# Patient Record
Sex: Male | Born: 1985
Health system: Southern US, Community
[De-identification: ages and names within clinical notes are randomized; demographics above are authoritative.]

---

## 2016-07-12 DIAGNOSIS — M722 Plantar fascial fibromatosis: Secondary | ICD-10-CM | POA: Insufficient documentation

## 2017-07-24 DIAGNOSIS — Z13220 Encounter for screening for lipoid disorders: Secondary | ICD-10-CM | POA: Diagnosis not present

## 2017-07-24 DIAGNOSIS — Z0000 Encounter for general adult medical examination without abnormal findings: Secondary | ICD-10-CM | POA: Diagnosis not present

## 2017-07-24 DIAGNOSIS — M25551 Pain in right hip: Secondary | ICD-10-CM | POA: Diagnosis not present

## 2017-08-07 DIAGNOSIS — M25851 Other specified joint disorders, right hip: Secondary | ICD-10-CM | POA: Diagnosis not present

## 2018-01-31 ENCOUNTER — Other Ambulatory Visit: Payer: Self-pay | Admitting: Internal Medicine

## 2018-01-31 DIAGNOSIS — M5489 Other dorsalgia: Secondary | ICD-10-CM | POA: Diagnosis not present

## 2018-02-07 ENCOUNTER — Ambulatory Visit
Admission: RE | Admit: 2018-02-07 | Discharge: 2018-02-07 | Disposition: A | Discharge status: Home / Self Care | Payer: 59 | Source: Ambulatory Visit | Attending: Internal Medicine | Admitting: Internal Medicine

## 2018-02-07 DIAGNOSIS — N1330 Unspecified hydronephrosis: Secondary | ICD-10-CM | POA: Diagnosis not present

## 2018-02-07 DIAGNOSIS — M5489 Other dorsalgia: Secondary | ICD-10-CM

## 2018-02-07 DIAGNOSIS — M549 Dorsalgia, unspecified: Secondary | ICD-10-CM | POA: Diagnosis not present

## 2018-02-07 DIAGNOSIS — N1339 Other hydronephrosis: Secondary | ICD-10-CM | POA: Diagnosis not present

## 2018-02-26 NOTE — Progress Notes (Signed)
02/27/2018 11:21 AM   Kevin EddyIan Percle 22-Sep-1985 [id number]  Referring provider: Marguarite ArbourSparks, Jeffrey D, MD [address] [facility] [place], [place] [id number]  Chief Complaint  Patient presents with  . Hydronephrosis    HPI: Patient is a 32 year old Caucasian male with bilateral hydronephrosis found on RUS referred by Dr. Judithann SheenSparks.  He presented to Dr. Judithann SheenSparks on 01/31/2018 for the complaints of right flank and right back pain that radiates to the right groin for two weeks.  RUS on 02/07/2018 noted BILATERAL mild hydronephrosis. Echogenic focus measuring 8 mm, RIGHT kidney lower pole collecting system, could represent debris or a nonshadowing calculus.  He states that he has had a right lower quadrant pain off and on for several months.  He then developed a right flank pain one month ago which prompted his visit to his PCP.  Patient denies any gross hematuria, dysuria or suprapubic/flank pain.  Patient denies any fevers, chills, nausea or vomiting.   His only urinating complaint is nocturia, but this is due to getting up with a newborn.    He does not have a history of stones.  He does not have a family history of stones.    He has not noted a bulge in the right lower quadrant.    PMH: History reviewed. No pertinent past medical history.  Surgical History: History reviewed. No pertinent surgical history.  Home Medications:  Allergies as of 02/27/2018   No Known Allergies     Medication List    as of 02/27/2018 11:21 AM   You have not been prescribed any medications.     Allergies: No Known Allergies  Family History: Family History  Problem Relation Age of Onset  . Prostate cancer Father     Social History:  reports that he has never smoked. He has never used smokeless tobacco. He reports that he drinks alcohol. He reports that he does not use drugs.  ROS: UROLOGY Frequent Urination?: No Hard to postpone urination?: No Burning/pain with urination?:  No Get up at night to urinate?: Yes Leakage of urine?: No Urine stream starts and stops?: No Trouble starting stream?: No Do you have to strain to urinate?: No Blood in urine?: No Urinary tract infection?: No Sexually transmitted disease?: No Injury to kidneys or bladder?: No Painful intercourse?: No Weak stream?: No Erection problems?: No Penile pain?: No  Gastrointestinal Nausea?: No Vomiting?: No Indigestion/heartburn?: No Diarrhea?: No Constipation?: No  Constitutional Fever: No Night sweats?: No Weight loss?: No Fatigue?: No  Skin Skin rash/lesions?: No Itching?: No  Eyes Blurred vision?: No Double vision?: No  Ears/Nose/Throat Sore throat?: No Sinus problems?: No  Hematologic/Lymphatic Swollen glands?: No Easy bruising?: No  Cardiovascular Leg swelling?: No Chest pain?: No  Respiratory Cough?: No Shortness of breath?: No  Endocrine Excessive thirst?: No  Musculoskeletal Back pain?: Yes Joint pain?: No  Neurological Headaches?: No Dizziness?: No  Psychologic Depression?: No Anxiety?: No  Physical Exam: BP 118/76 (BP Location: Left Arm, Patient Position: Sitting, Cuff Size: Large)   Pulse 64   Ht 6' (1.829 m)   Wt 213 lb 14.4 oz (97 kg)   BMI 29.01 kg/m   Constitutional:  Well nourished. Alert and oriented, No acute distress. HEENT: [place] AT, moist mucus membranes.  Trachea midline, no masses. Cardiovascular: No clubbing, cyanosis, or edema. Respiratory: Normal respiratory effort, no increased work of breathing. GI: Abdomen is soft, non tender, non distended, no abdominal masses. Liver and spleen not palpable.  No  hernias appreciated.  Stool sample for occult testing is not indicated.   GU: No CVA tenderness.  No bladder fullness or masses.  Patient with circumcised phallus.  Urethral meatus is patent.  No penile discharge. No penile lesions or rashes. Scrotum without lesions, cysts, rashes and/or edema.  Testicles are located scrotally  bilaterally. No masses are appreciated in the testicles. Left and right epididymis are normal. Rectal: Not performed Skin: No rashes, bruises or suspicious lesions. Lymph: No cervical or inguinal adenopathy. Neurologic: Grossly intact, no focal deficits, moving all 4 extremities. Psychiatric: Normal mood and affect.  Laboratory Data: PSA Trend  1.24 in 06/2016  0.89 in 07/2017  No results found for: WBC, HGB, HCT, MCV, PLT  No results found for: CREATININE  No results found for: PSA  No results found for: TESTOSTERONE  No results found for: HGBA1C  No results found for: TSH  No results found for: CHOL, HDL, CHOLHDL, VLDL, LDLCALC  No results found for: AST No results found for: ALT No components found for: ALKALINEPHOPHATASE No components found for: BILIRUBINTOTAL  No results found for: ESTRADIOL  Urinalysis Negative.   I have reviewed the labs.   Pertinent Imaging: CLINICAL DATA:  Back pain with sciatica.  EXAM: RENAL / URINARY TRACT ULTRASOUND COMPLETE  COMPARISON:  None.  FINDINGS: Right Kidney:  Length: 12.4 cm. Mild hydronephrosis. Normal echogenicity. Echogenic focus, 8 mm, lower pole collecting system could represent debris or a nonshadowing calculus.  Left Kidney:  Length: 12.3 cm. Mild hydronephrosis. Normal echogenicity. No mass.  Bladder:  Appears normal for degree of bladder distention.  IMPRESSION: BILATERAL mild hydronephrosis. Echogenic focus measuring 8 mm, RIGHT kidney lower pole collecting system, could represent debris or a nonshadowing calculus.   Electronically Signed   By: Elsie StainJohn T Curnes M.D.   On: 02/07/2018 15:45 I have independently reviewed the films.    Assessment & Plan:    1. Bilateral hydronephrosis Obtain a CTU  - I explained to the patient that a contrast material will be injected into a vein and that in rare instances, an allergic reaction can result and may even life threatening   The patient denies  any allergies to contrast, iodine and/or seafood and is not taking metformin. The patient will return following all of the above for discussion of the results.  UA negative Urine culture  2. Right renal stone CTU pending    Return for CT report .  These notes generated with voice recognition software. I apologize for typographical errors.  Michiel CowboySHANNON Dravyn Severs, PA-C  [facility] [address]  [address] [place], [place] [address] [phone]

## 2018-02-27 ENCOUNTER — Ambulatory Visit (INDEPENDENT_AMBULATORY_CARE_PROVIDER_SITE_OTHER): Payer: 59 | Admitting: Urology

## 2018-02-27 ENCOUNTER — Encounter: Payer: Self-pay | Admitting: Urology

## 2018-02-27 VITALS — BP 118/76 | HR 64 | Ht 72.0 in | Wt 213.9 lb

## 2018-02-27 DIAGNOSIS — N1330 Unspecified hydronephrosis: Secondary | ICD-10-CM

## 2018-02-27 DIAGNOSIS — N200 Calculus of kidney: Secondary | ICD-10-CM | POA: Diagnosis not present

## 2018-02-27 LAB — URINALYSIS, COMPLETE
Bilirubin, UA: NEGATIVE
Glucose, UA: NEGATIVE
Ketones, UA: NEGATIVE
Leukocytes, UA: NEGATIVE
Nitrite, UA: NEGATIVE
PH UA: 7 (ref 5.0–7.5)
Protein, UA: NEGATIVE
RBC UA: NEGATIVE
SPEC GRAV UA: 1.02 (ref 1.005–1.030)
Urobilinogen, Ur: 0.2 mg/dL (ref 0.2–1.0)

## 2018-03-02 LAB — CULTURE, URINE COMPREHENSIVE

## 2018-03-19 ENCOUNTER — Ambulatory Visit
Admission: RE | Admit: 2018-03-19 | Discharge: 2018-03-19 | Disposition: A | Discharge status: Home / Self Care | Payer: Commercial Managed Care - HMO | Source: Ambulatory Visit | Attending: Urology | Admitting: Urology

## 2018-03-19 DIAGNOSIS — N200 Calculus of kidney: Secondary | ICD-10-CM | POA: Diagnosis not present

## 2018-03-19 DIAGNOSIS — N1330 Unspecified hydronephrosis: Secondary | ICD-10-CM

## 2018-03-19 MED ORDER — IOPAMIDOL (ISOVUE-300) INJECTION 61%
150.0000 mL | Freq: Once | INTRAVENOUS | Status: AC | PRN
  Administered 2018-03-19: 150 mL via INTRAVENOUS

## 2018-03-20 ENCOUNTER — Ambulatory Visit (INDEPENDENT_AMBULATORY_CARE_PROVIDER_SITE_OTHER): Payer: 59 | Admitting: Urology

## 2018-03-20 ENCOUNTER — Encounter: Payer: Self-pay | Admitting: Urology

## 2018-03-20 VITALS — BP 136/87 | HR 69 | Resp 14 | Ht 72.0 in | Wt 214.0 lb

## 2018-03-20 DIAGNOSIS — N200 Calculus of kidney: Secondary | ICD-10-CM

## 2018-03-20 DIAGNOSIS — R1031 Right lower quadrant pain: Secondary | ICD-10-CM | POA: Diagnosis not present

## 2018-03-20 NOTE — Progress Notes (Signed)
03/20/2018 6:13 PM   Kevin Foster May 10, 1986 [id number]  Referring provider: Marguarite Arbour, MD [address] [facility] [place], [place] [address]  Chief Complaint  Patient presents with  . Follow-up    HPI: Patient is a 32 year old Caucasian male with bilateral hydronephrosis found on RUS who presents today to discuss CTU report.  Background history He presented to Dr. Judithann Sheen on 01/31/2018 for the complaints of right flank and right back pain that radiates to the right groin for two weeks.  RUS on 02/07/2018 noted BILATERAL mild hydronephrosis. Echogenic focus measuring 8 mm, RIGHT kidney lower pole collecting system, could represent debris or a nonshadowing calculus.  He states that he has had a right lower quadrant pain off and on for several months.  He then developed a right flank pain one month ago which prompted his visit to his PCP.  Patient denies any gross hematuria, dysuria or suprapubic/flank pain.  Patient denies any fevers, chills, nausea or vomiting.  His only urinating complaint is nocturia, but this is due to getting up with a newborn.   He does not have a history of stones.  He does not have a family history of stones.  He has not noted a bulge in the right lower quadrant.    CTU on 03/20/2018 revealed tiny nonobstructing right renal calculus. No evidence of ureteral calculi, hydronephrosis, or other acute findings.    He still continues to have the right lower quadrant pain.  It has not changed in its intensity, consistency, location or radiation since his last visit with [place].  He has no new symptoms to report at this visit.  Patient denies any gross hematuria, dysuria or suprapubic/flank pain.  Patient denies any fevers, chills, nausea or vomiting.   PMH: No past medical history on file.  Surgical History: No past surgical history on file.  Home Medications:  Allergies as of 03/20/2018   No Known Allergies     Medication List    as of  03/20/2018 11:59 PM   You have not been prescribed any medications.     Allergies: No Known Allergies  Family History: Family History  Problem Relation Age of Onset  . Prostate cancer Father     Social History:  reports that he has never smoked. He has never used smokeless tobacco. He reports that he drinks alcohol. He reports that he does not use drugs.  ROS: UROLOGY Frequent Urination?: No Hard to postpone urination?: No Burning/pain with urination?: No Get up at night to urinate?: No Leakage of urine?: No Urine stream starts and stops?: No Trouble starting stream?: No Do you have to strain to urinate?: No Blood in urine?: No Urinary tract infection?: No Sexually transmitted disease?: No Injury to kidneys or bladder?: No Painful intercourse?: No Weak stream?: No Erection problems?: No Penile pain?: No  Gastrointestinal Nausea?: No Vomiting?: No Indigestion/heartburn?: No Diarrhea?: No Constipation?: No  Constitutional Fever: No Night sweats?: No Weight loss?: No Fatigue?: No  Skin Skin rash/lesions?: No Itching?: No  Eyes Blurred vision?: No Double vision?: No  Ears/Nose/Throat Sore throat?: No Sinus problems?: No  Hematologic/Lymphatic Swollen glands?: No Easy bruising?: No  Cardiovascular Leg swelling?: No Chest pain?: No  Respiratory Cough?: No Shortness of breath?: No  Endocrine Excessive thirst?: No  Musculoskeletal Back pain?: Yes Joint pain?: No  Neurological Headaches?: No Dizziness?: No  Psychologic Depression?: No Anxiety?: No  Physical Exam: BP 136/87   Pulse 69   Resp 14  Ht 6' (1.829 m)   Wt 214 lb (97.1 kg)   BMI 29.02 kg/m   Constitutional: Well nourished. Alert and oriented, No acute distress. HEENT: [place] AT, moist mucus membranes. Trachea midline, no masses. Cardiovascular: No clubbing, cyanosis, or edema. Respiratory: Normal respiratory effort, no increased work of breathing. Skin: No rashes, bruises  or suspicious lesions. Lymph: No cervical or inguinal adenopathy. Neurologic: Grossly intact, no focal deficits, moving all 4 extremities. Psychiatric: Normal mood and affect.  Laboratory Data: PSA Trend  1.24 in 06/2016  0.89 in 07/2017  No results found for: WBC, HGB, HCT, MCV, PLT  No results found for: CREATININE  No results found for: PSA  No results found for: TESTOSTERONE  No results found for: HGBA1C  No results found for: TSH  No results found for: CHOL, HDL, CHOLHDL, VLDL, LDLCALC  No results found for: AST No results found for: ALT No components found for: ALKALINEPHOPHATASE No components found for: BILIRUBINTOTAL  No results found for: ESTRADIOL  Urinalysis Negative on 02/27/2018 Urine culture negative on 02/27/2018 I have reviewed the labs.   Pertinent Imaging: CLINICAL DATA:  Right flank pain and right lower quadrant pain for 2 weeks. Mild bilateral hydronephrosis seen on recent ultrasound.  EXAM: CT ABDOMEN AND PELVIS WITHOUT AND WITH CONTRAST  TECHNIQUE: Multidetector CT imaging of the abdomen and pelvis was performed following the standard protocol before and following the bolus administration of intravenous contrast.  CONTRAST:  ISOVUE-300 IOPAMIDOL (ISOVUE-300) INJECTION 61%  COMPARISON:  Renal ultrasound on 02/07/2018  FINDINGS: Lower Chest: No acute findings.  Hepatobiliary: No hepatic masses identified. Gallbladder is unremarkable.  Pancreas:  No mass or inflammatory changes.  Spleen: Within normal limits in size and appearance.  Adrenals/Urinary Tract: No adrenal masses identified. A 3 mm nonobstructing calculus is seen in the lower pole of the right kidney. No evidence of ureteral calculi or hydronephrosis. No renal masses identified. No masses seen involving the ureters or bladder.  Stomach/Bowel: No evidence of obstruction, inflammatory process or abnormal fluid collections. Normal appendix  visualized.  Vascular/Lymphatic: No pathologically enlarged lymph nodes. No abdominal aortic aneurysm.  Reproductive:  No mass or other significant abnormality.  Other:  None.  Musculoskeletal:  No suspicious bone lesions identified.  IMPRESSION: Tiny nonobstructing right renal calculus. No evidence of ureteral calculi, hydronephrosis, or other acute findings.   Electronically Signed   By: Myles Rosenthal M.D.   On: 03/19/2018 10:28 I have independently reviewed the films.    Assessment & Plan:    1. Bilateral hydronephrosis No hydro on CTU  2. Right renal stone 3 mm right renal stone non obstructing Explained to the patient that this is not the source of his right lower quadrant pain and no further follow-up or monitoring as needed  3. Right lower quadrant pain Will refer to physical therapy for their evaluation of the right lower quadrant pain   Return if symptoms worsen or fail to improve.  These notes generated with voice recognition software. I apologize for typographical errors.  Michiel Cowboy, PA-C  [facility] [address]  [address] [place], [place] [address] [phone]  I spent 15 with this patient of which greater than 50% was spent in counseling and coordination of care with the patient.

## 2018-04-02 ENCOUNTER — Ambulatory Visit: Payer: 59 | Admitting: Physical Therapy

## 2018-07-30 DIAGNOSIS — Z13220 Encounter for screening for lipoid disorders: Secondary | ICD-10-CM | POA: Diagnosis not present

## 2018-07-30 DIAGNOSIS — Z0000 Encounter for general adult medical examination without abnormal findings: Secondary | ICD-10-CM | POA: Diagnosis not present

## 2018-07-30 DIAGNOSIS — Z125 Encounter for screening for malignant neoplasm of prostate: Secondary | ICD-10-CM | POA: Diagnosis not present

## 2018-07-30 DIAGNOSIS — R0789 Other chest pain: Secondary | ICD-10-CM | POA: Diagnosis not present

## 2018-08-13 DIAGNOSIS — Z125 Encounter for screening for malignant neoplasm of prostate: Secondary | ICD-10-CM | POA: Diagnosis not present

## 2018-08-13 DIAGNOSIS — Z13220 Encounter for screening for lipoid disorders: Secondary | ICD-10-CM | POA: Diagnosis not present

## 2018-08-13 DIAGNOSIS — Z0000 Encounter for general adult medical examination without abnormal findings: Secondary | ICD-10-CM | POA: Diagnosis not present

## 2020-01-02 IMAGING — US US RENAL
1 series · 14 of 25 positions shown · non-contrast
Comparison: None.

CLINICAL DATA: Back pain with sciatica.

EXAM:
RENAL / URINARY TRACT ULTRASOUND COMPLETE

[Series 1: us renal · 14 of 89 slices shown]
[im 1/89]
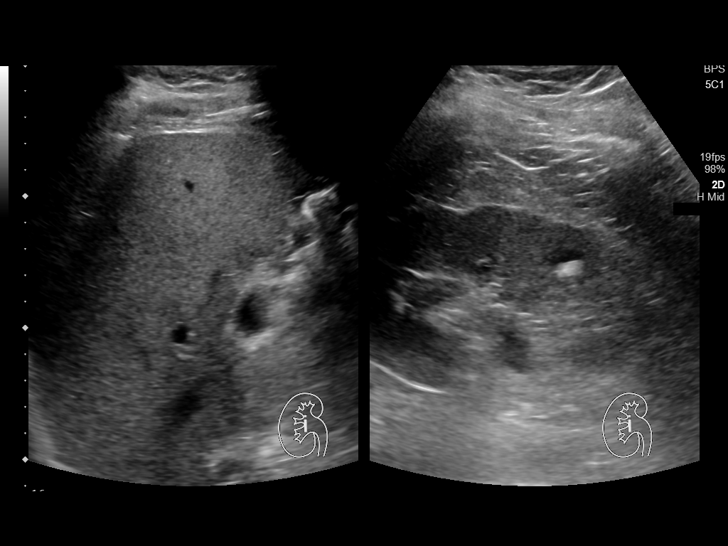
[im 8/89]
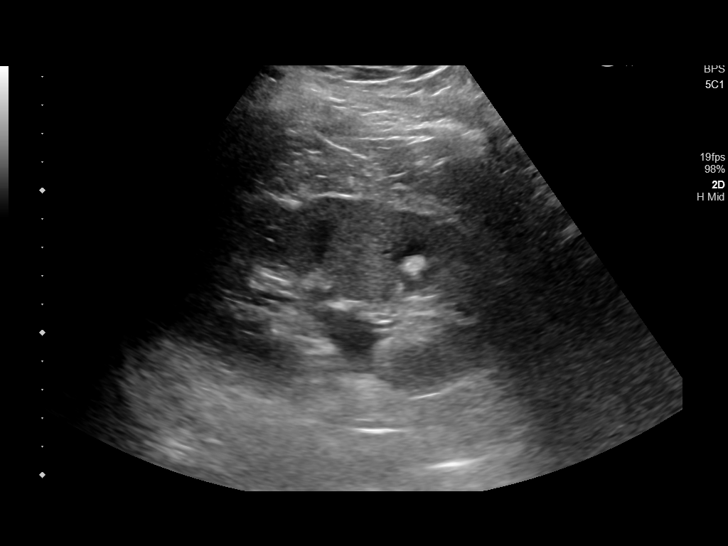
[im 15/89]
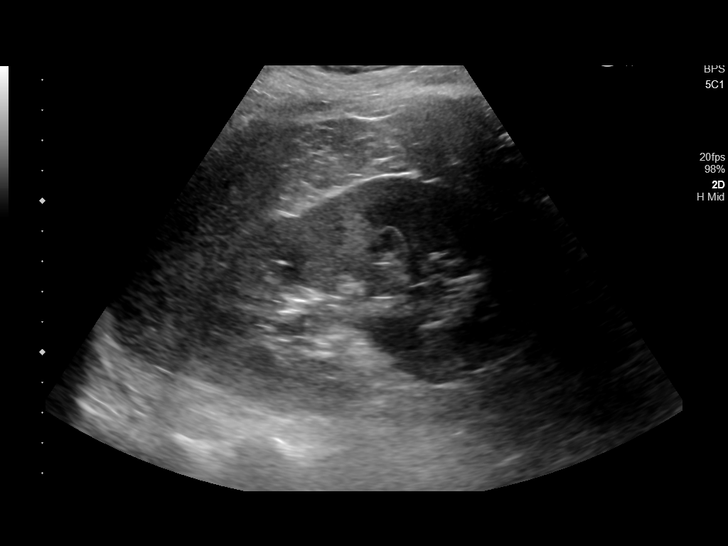
[im 23/89]
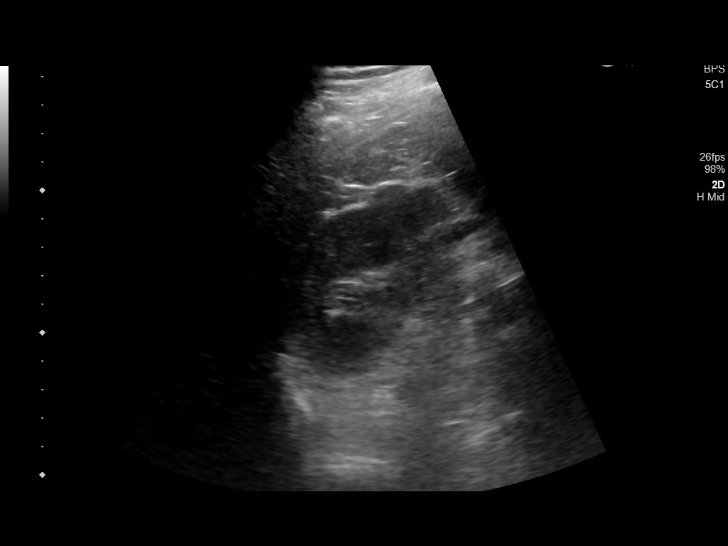
[im 30/89]
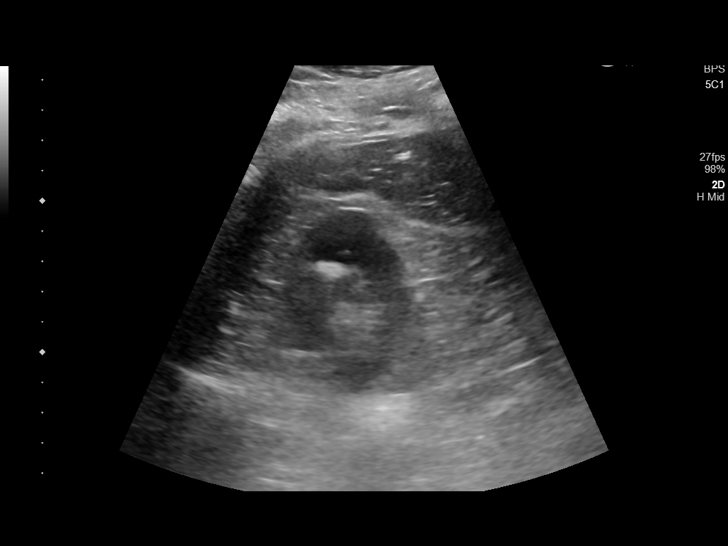
[im 34/89]
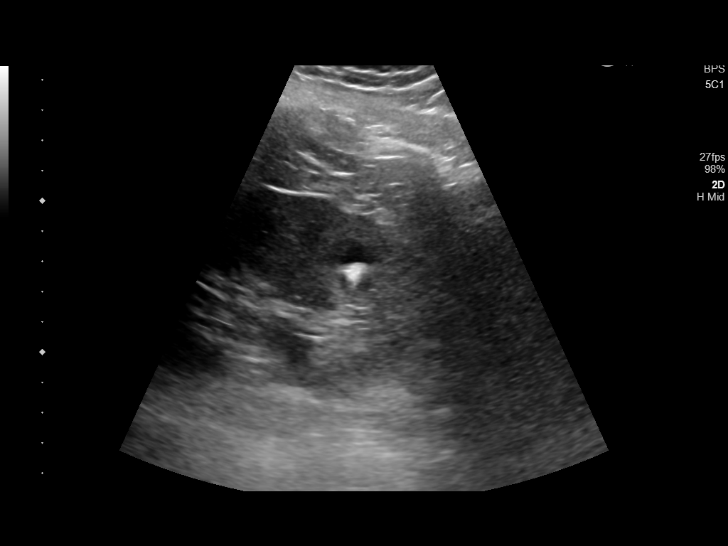
[im 41/89]
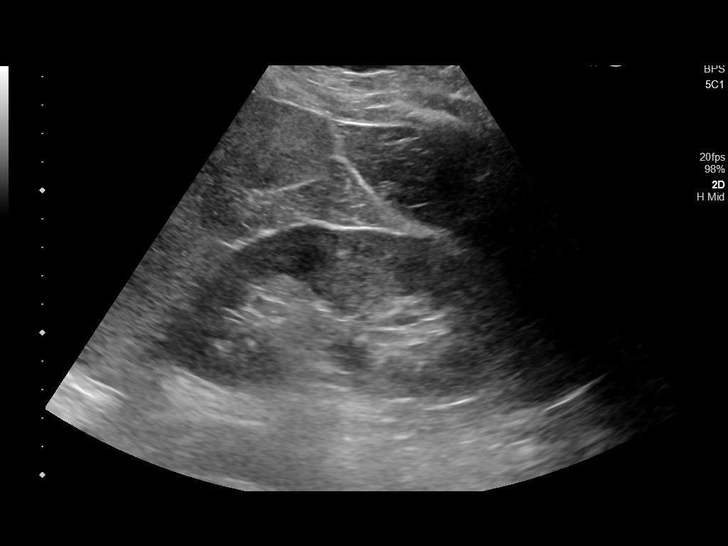
[im 48/89]
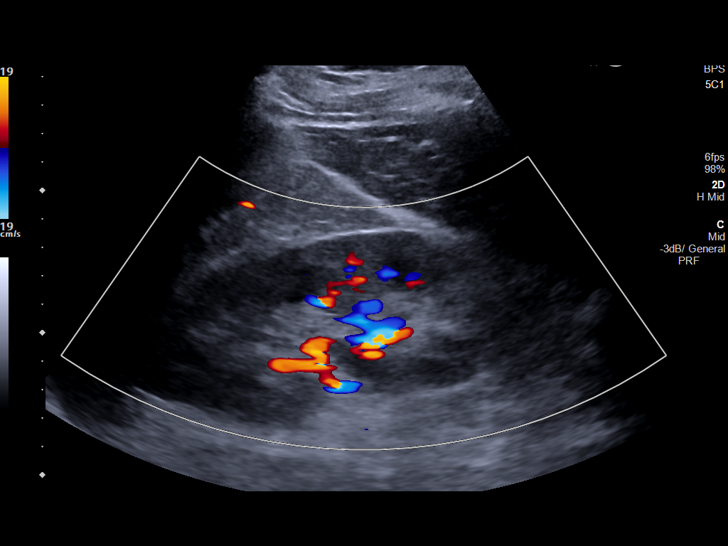
[im 56/89]
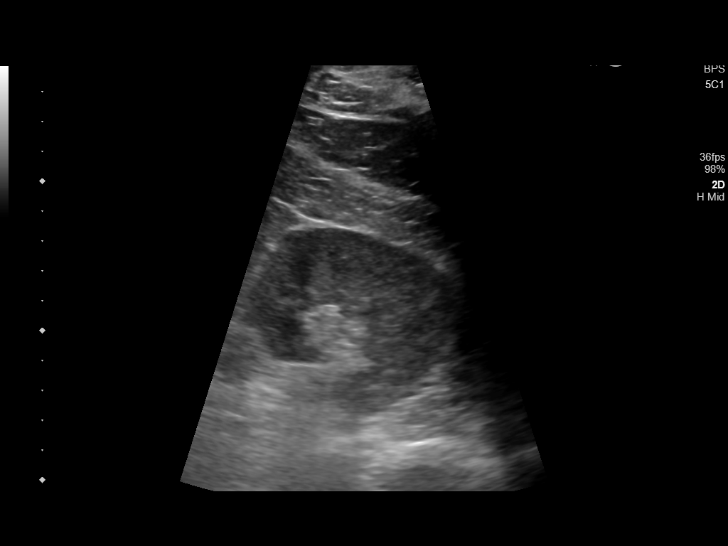
[im 59/89]
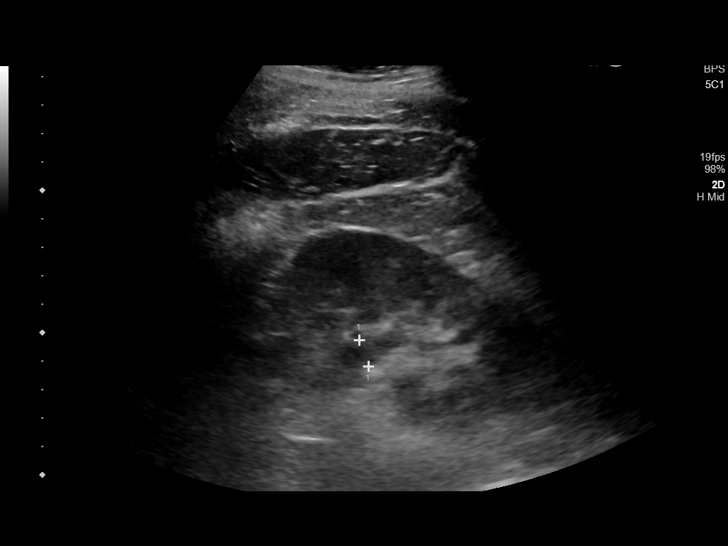
[im 67/89]
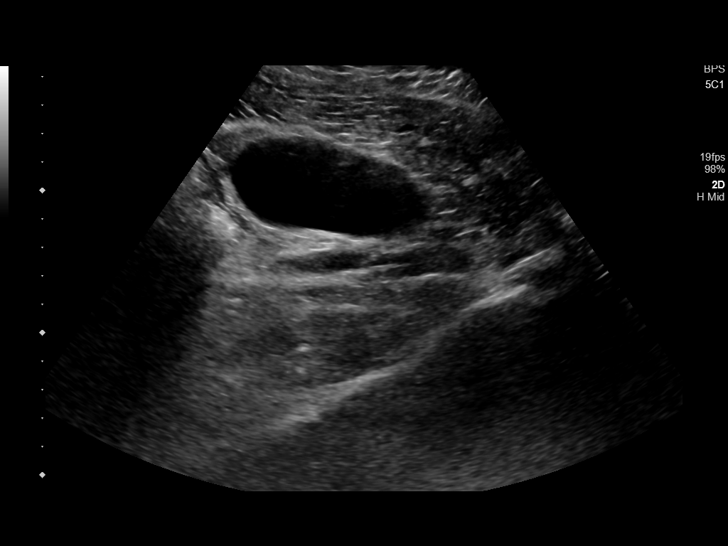
[im 74/89]
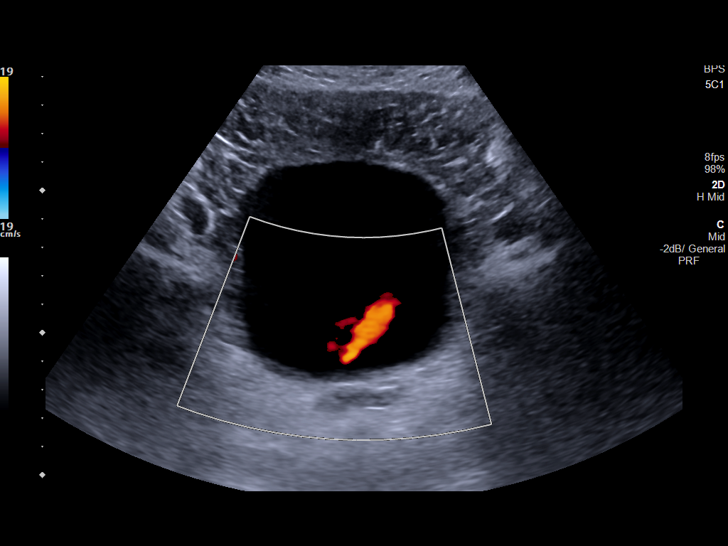
[im 81/89]
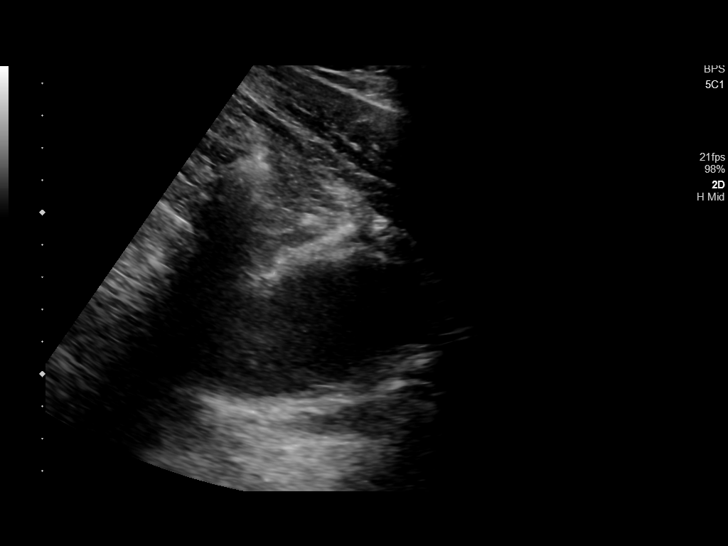
[im 89/89]
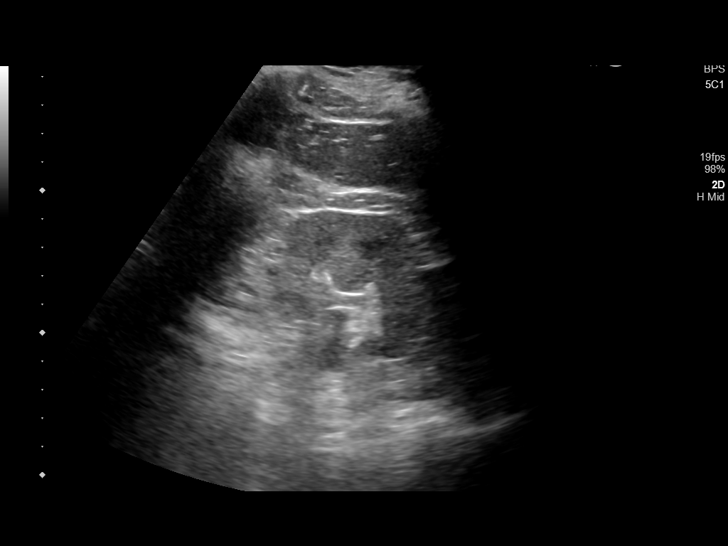

[14 of 25 positions shown; findings below may reference images not displayed]

FINDINGS: Right Kidney:

Length: 12.4 cm.. Mild hydronephrosis. Normal echogenicity.
Echogenic focus, 8 mm, lower pole collecting system could represent
debris or a nonshadowing calculus.

Left Kidney:

Length: 12.3 cm. Mild hydronephrosis. Normal echogenicity. No mass.

Bladder:

Appears normal for degree of bladder distention.
IMPRESSION: BILATERAL mild hydronephrosis. Echogenic focus measuring 8 mm, RIGHT
kidney lower pole collecting system, could represent debris or a
nonshadowing calculus.
